# Patient Record
Sex: Male | Born: 1955 | Race: White | Hispanic: No | Marital: Married | Smoking: Never smoker
Health system: Southern US, Community
[De-identification: ages and names within clinical notes are randomized; demographics above are authoritative.]

## PROBLEM LIST (undated history)

## (undated) DIAGNOSIS — K219 Gastro-esophageal reflux disease without esophagitis: Secondary | ICD-10-CM

## (undated) DIAGNOSIS — F32A Depression, unspecified: Secondary | ICD-10-CM

## (undated) DIAGNOSIS — K7460 Unspecified cirrhosis of liver: Secondary | ICD-10-CM

## (undated) DIAGNOSIS — T8859XA Other complications of anesthesia, initial encounter: Secondary | ICD-10-CM

## (undated) DIAGNOSIS — C760 Malignant neoplasm of head, face and neck: Secondary | ICD-10-CM

## (undated) DIAGNOSIS — F419 Anxiety disorder, unspecified: Secondary | ICD-10-CM

## (undated) DIAGNOSIS — G4730 Sleep apnea, unspecified: Secondary | ICD-10-CM

## (undated) DIAGNOSIS — I10 Essential (primary) hypertension: Secondary | ICD-10-CM

## (undated) DIAGNOSIS — C801 Malignant (primary) neoplasm, unspecified: Secondary | ICD-10-CM

## (undated) HISTORY — PX: HEMORRHOID SURGERY: SHX153

## (undated) HISTORY — PX: EXCISION OF TONGUE LESION: SHX6434

## (undated) HISTORY — PX: FOOT SURGERY: SHX648

---

## 1999-04-02 ENCOUNTER — Emergency Department (HOSPITAL_COMMUNITY): Admission: EM | Admit: 1999-04-02 | Discharge: 1999-04-02 | Payer: Self-pay | Admitting: Emergency Medicine

## 1999-04-03 ENCOUNTER — Ambulatory Visit (HOSPITAL_COMMUNITY): Admission: RE | Admit: 1999-04-03 | Discharge: 1999-04-04 | Payer: Self-pay | Admitting: General Surgery

## 2001-05-10 ENCOUNTER — Encounter: Payer: Self-pay | Admitting: Emergency Medicine

## 2001-05-10 ENCOUNTER — Emergency Department (HOSPITAL_COMMUNITY): Admission: EM | Admit: 2001-05-10 | Discharge: 2001-05-10 | Payer: Self-pay | Admitting: Emergency Medicine

## 2005-11-30 ENCOUNTER — Encounter: Admission: RE | Admit: 2005-11-30 | Discharge: 2005-11-30 | Payer: Self-pay | Admitting: General Surgery

## 2006-02-20 ENCOUNTER — Ambulatory Visit (HOSPITAL_COMMUNITY): Admission: RE | Admit: 2006-02-20 | Discharge: 2006-02-20 | Payer: Self-pay | Admitting: General Surgery

## 2019-09-23 ENCOUNTER — Encounter: Payer: Self-pay | Admitting: Podiatry

## 2019-09-23 ENCOUNTER — Ambulatory Visit: Payer: No Typology Code available for payment source

## 2019-09-28 ENCOUNTER — Ambulatory Visit (INDEPENDENT_AMBULATORY_CARE_PROVIDER_SITE_OTHER): Payer: No Typology Code available for payment source | Admitting: Podiatry

## 2019-09-28 ENCOUNTER — Other Ambulatory Visit: Payer: Self-pay | Admitting: Podiatry

## 2019-09-28 ENCOUNTER — Encounter: Payer: Self-pay | Admitting: Podiatry

## 2019-09-28 ENCOUNTER — Other Ambulatory Visit: Payer: Self-pay

## 2019-09-28 ENCOUNTER — Ambulatory Visit (INDEPENDENT_AMBULATORY_CARE_PROVIDER_SITE_OTHER): Payer: No Typology Code available for payment source

## 2019-09-28 DIAGNOSIS — M7671 Peroneal tendinitis, right leg: Secondary | ICD-10-CM

## 2019-09-28 NOTE — Progress Notes (Signed)
  Subjective:  Patient ID: David Middleton, male    DOB: [birth year],  MRN: [id number] HPI Chief Complaint  Patient presents with  . Foot Pain    Lateral side (5th met base) right - aching x 6 months, no injury, tried taping and aspirin  . New Patient (Initial Visit)    64 y.o. male presents with the above complaint.   ROS: Denies fever chills nausea vomiting muscle aches pains calf pain back pain chest pain shortness of breath.  No past medical history on file.   Current Outpatient Medications:  .  gabapentin (NEURONTIN) 300 MG capsule, Take 300 mg by mouth at bedtime., Disp: , Rfl:  .  lisinopril (ZESTRIL) 20 MG tablet, Take 10 mg by mouth daily., Disp: , Rfl:  .  omeprazole (PRILOSEC) 20 MG capsule, Take 20 mg by mouth daily., Disp: , Rfl:  .  sertraline (ZOLOFT) 100 MG tablet, Take 100 mg by mouth daily., Disp: , Rfl:   No Known Allergies Review of Systems Objective:  There were no vitals filed for this visit.  General: Well developed, nourished, in no acute distress, alert and oriented x3   Dermatological: Skin is warm, dry and supple bilateral. Nails x 10 are well maintained; remaining integument appears unremarkable at this time. There are no open sores, no preulcerative lesions, no rash or signs of infection present.  Vascular: Dorsalis Pedis artery and Posterior Tibial artery pedal pulses are 2/4 bilateral with immedate capillary fill time. Pedal hair growth present. No varicosities and no lower extremity edema present bilateral.   Neruologic: Grossly intact via light touch bilateral. Vibratory intact via tuning fork bilateral. Protective threshold with Semmes Wienstein monofilament intact to all pedal sites bilateral. Patellar and Achilles deep tendon reflexes 2+ bilateral. No Babinski or clonus noted bilateral.   Musculoskeletal: No gross boney pedal deformities bilateral. No pain, crepitus, or limitation noted with foot and ankle range of motion bilateral. Muscular strength  5/5 in all groups tested bilateral.  Pain on palpation of the peroneus brevis tendon just proximal to the fifth metatarsal base of the right foot.  He has no pain on palpation of the fifth met base.  Gait: Unassisted, Nonantalgic.    Radiographs:  Soft tissue increase in density at the peroneal level.  There appears to be an old avulsion fracture off of the fifth met base.  There is appears to be an old transverse fracture near the fifth met base just proximal to the area of a Jones fracture.  An os perineum is noted.  Assessment & Plan:   Assessment: Peroneal tendinitis cannot rule out a tear of the posterior tibial tendon.  Plan: We discussed etiology pathology conservative versus surgical therapies at this point I put him in a Tri-Lock brace and injected the area with 2 mg of dexamethasone and local anesthetic.  Discussed the use of Voltaren gel and good shoe gear.  I will follow-up with him in 6 weeks.     Lasheena Frieze T. [place], [place]

## 2019-11-03 NOTE — Progress Notes (Signed)
This encounter was created in error - please disregard.

## 2019-11-09 ENCOUNTER — Encounter: Payer: Self-pay | Admitting: Podiatry

## 2019-11-09 ENCOUNTER — Other Ambulatory Visit: Payer: Self-pay

## 2019-11-09 ENCOUNTER — Ambulatory Visit (INDEPENDENT_AMBULATORY_CARE_PROVIDER_SITE_OTHER): Payer: No Typology Code available for payment source | Admitting: Podiatry

## 2019-11-09 DIAGNOSIS — M7671 Peroneal tendinitis, right leg: Secondary | ICD-10-CM | POA: Diagnosis not present

## 2019-11-09 NOTE — Progress Notes (Signed)
He presents today for follow-up of his peroneal tendinitis in his right foot.  States that he is doing great and was 100% better now it is regressed about 80%.  He states that I do not know why is coming back maybe have done something to it.  Objective: Vital signs are stable alert oriented x3.  Pulses are palpable.  There is no erythema edema cellulitis drainage or odor he has pain on palpation as the peroneus longus dives through the arcuate portion of the cuboid.  He has pain on plantarflexion and eversion against resistance.  This is consistent with possible tear or tendinitis of the peroneus longus tendon at that point.  Assessment: Cannot rule out peroneal longus tendinitis at the arcuate portion of the cuboid.  Plan: At this point I injected and once again start him on a Medrol Dosepak to be followed by meloxicam.  And he will utilize his Tri-Lock brace on which we dispensed today.  I will follow-up with him in 1 month if he has not improved considerably MRI will be necessary.

## 2019-12-21 ENCOUNTER — Ambulatory Visit: Payer: No Typology Code available for payment source | Admitting: Podiatry

## 2020-11-01 DIAGNOSIS — I10 Essential (primary) hypertension: Secondary | ICD-10-CM | POA: Diagnosis not present

## 2020-11-01 DIAGNOSIS — K219 Gastro-esophageal reflux disease without esophagitis: Secondary | ICD-10-CM | POA: Diagnosis not present

## 2020-11-01 DIAGNOSIS — K7460 Unspecified cirrhosis of liver: Secondary | ICD-10-CM | POA: Diagnosis not present

## 2020-11-01 DIAGNOSIS — Z9181 History of falling: Secondary | ICD-10-CM | POA: Diagnosis not present

## 2020-11-07 ENCOUNTER — Ambulatory Visit: Payer: No Typology Code available for payment source | Admitting: Podiatry

## 2020-11-07 ENCOUNTER — Other Ambulatory Visit: Payer: Self-pay

## 2020-11-07 ENCOUNTER — Encounter: Payer: Self-pay | Admitting: Podiatry

## 2020-11-07 ENCOUNTER — Ambulatory Visit (INDEPENDENT_AMBULATORY_CARE_PROVIDER_SITE_OTHER): Payer: Medicare Other

## 2020-11-07 DIAGNOSIS — M722 Plantar fascial fibromatosis: Secondary | ICD-10-CM

## 2020-11-07 MED ORDER — METHYLPREDNISOLONE 4 MG PO TBPK: ORAL_TABLET | ORAL | 0 refills | Status: AC

## 2020-11-07 MED ORDER — MELOXICAM 15 MG PO TABS: 15.0000 mg | ORAL_TABLET | Freq: Every day | ORAL | 3 refills | Status: DC

## 2020-11-07 MED ORDER — TRIAMCINOLONE ACETONIDE 40 MG/ML IJ SUSP
40.0000 mg | Freq: Once | INTRAMUSCULAR | Status: AC
  Administered 2020-11-07: 40 mg

## 2020-11-07 NOTE — Progress Notes (Signed)
He presents today chief complaint of plantar lateral here heel pain bilaterally right greater than left states been aching for a couple of months more pain in the p.m. after he has been sitting for a while and then gets back up to walk.  States that mornings really are not that bad.  Denies any changes in his past medical history medications allergies surgery social history.  Denies any new shoes or injuries.  Denies any new or an increase in activities.  Objective: Vital signs are stable alert and oriented x3.  Pulses are palpable neurologic sensorium is intact Deetjen reflexes are intact muscle strength is normal symmetrical.  He does have heel pain on palpation medial calcaneal tubercle as well as the lateral calcaneal tubercles right greater than left.  Radiographs taken today demonstrate calcaneal heel spurs plantar and posterior soft tissue increase in density plantar fascial kidney insertion site.  Assessment: Planter fasciitis.  Plan: I injected bilaterally started him on a Medrol Dosepak and I he will follow that with meloxicam.  Placed him in a plantar fascial brace bilaterally we discussed appropriate shoe gear stretching exercises and ice therapy.  I would like to follow-up with him in about 1 month

## 2020-11-07 NOTE — Patient Instructions (Signed)

## 2020-12-07 ENCOUNTER — Ambulatory Visit: Payer: Medicare Other | Admitting: Podiatry

## 2021-05-23 DIAGNOSIS — T1502XA Foreign body in cornea, left eye, initial encounter: Secondary | ICD-10-CM | POA: Diagnosis not present

## 2021-05-24 DIAGNOSIS — T1502XD Foreign body in cornea, left eye, subsequent encounter: Secondary | ICD-10-CM | POA: Diagnosis not present

## 2021-07-10 DIAGNOSIS — M9905 Segmental and somatic dysfunction of pelvic region: Secondary | ICD-10-CM | POA: Diagnosis not present

## 2021-07-10 DIAGNOSIS — M9903 Segmental and somatic dysfunction of lumbar region: Secondary | ICD-10-CM | POA: Diagnosis not present

## 2021-07-10 DIAGNOSIS — M5126 Other intervertebral disc displacement, lumbar region: Secondary | ICD-10-CM | POA: Diagnosis not present

## 2021-07-10 DIAGNOSIS — M5441 Lumbago with sciatica, right side: Secondary | ICD-10-CM | POA: Diagnosis not present

## 2021-07-11 DIAGNOSIS — M5416 Radiculopathy, lumbar region: Secondary | ICD-10-CM | POA: Diagnosis not present

## 2021-07-12 ENCOUNTER — Other Ambulatory Visit: Payer: Self-pay | Admitting: Family Medicine

## 2021-07-12 DIAGNOSIS — M5416 Radiculopathy, lumbar region: Secondary | ICD-10-CM

## 2021-07-19 ENCOUNTER — Other Ambulatory Visit (HOSPITAL_COMMUNITY): Payer: Self-pay | Admitting: Family Medicine

## 2021-07-19 DIAGNOSIS — M5416 Radiculopathy, lumbar region: Secondary | ICD-10-CM

## 2021-07-23 ENCOUNTER — Other Ambulatory Visit (HOSPITAL_COMMUNITY): Payer: Self-pay | Admitting: Family Medicine

## 2021-07-23 ENCOUNTER — Ambulatory Visit (HOSPITAL_COMMUNITY)
Admission: RE | Admit: 2021-07-23 | Discharge: 2021-07-23 | Disposition: A | Discharge status: Home / Self Care | Payer: Medicare Other | Source: Ambulatory Visit | Attending: Family Medicine | Admitting: Family Medicine

## 2021-07-23 DIAGNOSIS — M5416 Radiculopathy, lumbar region: Secondary | ICD-10-CM | POA: Diagnosis not present

## 2021-07-23 DIAGNOSIS — M48061 Spinal stenosis, lumbar region without neurogenic claudication: Secondary | ICD-10-CM | POA: Diagnosis not present

## 2021-07-23 DIAGNOSIS — Z0189 Encounter for other specified special examinations: Secondary | ICD-10-CM

## 2021-07-23 DIAGNOSIS — Z01818 Encounter for other preprocedural examination: Secondary | ICD-10-CM | POA: Diagnosis not present

## 2021-07-23 DIAGNOSIS — M5126 Other intervertebral disc displacement, lumbar region: Secondary | ICD-10-CM | POA: Diagnosis not present

## 2021-07-26 ENCOUNTER — Ambulatory Visit: Payer: Self-pay

## 2021-07-28 ENCOUNTER — Other Ambulatory Visit: Payer: Self-pay | Admitting: Neurosurgery

## 2021-07-28 DIAGNOSIS — M5127 Other intervertebral disc displacement, lumbosacral region: Secondary | ICD-10-CM | POA: Diagnosis not present

## 2021-08-02 ENCOUNTER — Encounter (HOSPITAL_COMMUNITY): Payer: Self-pay | Admitting: Neurosurgery

## 2021-08-02 ENCOUNTER — Other Ambulatory Visit: Payer: Self-pay

## 2021-08-02 NOTE — Progress Notes (Signed)
Per Dr. Therisa Doyne, the pt can have a light breakfast until 0700 day of surgery. Pt made aware.

## 2021-08-02 NOTE — Progress Notes (Signed)
PCP - Dr. Lisbeth Ply  Cardiologist - Denies  EP- Denies  Endocrine- Denies  Pulm- Denies  Chest x-ray - Denies  EKG - 08/03/21- Day of Surgery  Stress Test - Denies  ECHO - Denies  Cardiac Cath - Denies  AICD-na PM-na LOOP-na  Nerve Stimulator- Denies  Dialysis- Denies  Sleep Study - Yes- Positive CPAP - Denies  LABS- 08/03/21: CBC, CMP, PCR  ASA- Denies  ERAS- Yes- clears until 1355  HA1C- Denies  Anesthesia- No  Pt denies having chest pain, sob, or fever during the pre-op phone call. All instructions explained to the pt, with a verbal understanding of the material including: as of today,  stop taking all Aspirin (unless instructed by your doctor) and Other Aspirin containing products, Vitamins, Fish oils, and Herbal medications. Also stop all NSAIDS i.e. Advil, Ibuprofen, Motrin, Aleve, Anaprox, Naproxen, BC, Goody Powders, and all Supplements.  Pt also instructed to wear a mask and social distance if he goes out. The opportunity to ask questions was provided.

## 2021-08-03 ENCOUNTER — Observation Stay (HOSPITAL_COMMUNITY)
Admission: RE | Admit: 2021-08-03 | Discharge: 2021-08-04 | Disposition: A | Discharge status: Home / Self Care | Payer: Medicare Other | Attending: Neurosurgery | Admitting: Neurosurgery

## 2021-08-03 ENCOUNTER — Encounter (HOSPITAL_COMMUNITY): Payer: Self-pay | Admitting: Neurosurgery

## 2021-08-03 ENCOUNTER — Other Ambulatory Visit: Payer: Self-pay

## 2021-08-03 ENCOUNTER — Encounter (HOSPITAL_COMMUNITY)
Admission: RE | Disposition: A | Discharge status: Home / Self Care | Payer: Self-pay | Source: Home / Self Care | Attending: Neurosurgery

## 2021-08-03 ENCOUNTER — Ambulatory Visit (HOSPITAL_BASED_OUTPATIENT_CLINIC_OR_DEPARTMENT_OTHER): Payer: Medicare Other | Admitting: Anesthesiology

## 2021-08-03 ENCOUNTER — Ambulatory Visit (HOSPITAL_COMMUNITY): Payer: Medicare Other | Admitting: Anesthesiology

## 2021-08-03 ENCOUNTER — Ambulatory Visit (HOSPITAL_COMMUNITY): Payer: Medicare Other

## 2021-08-03 DIAGNOSIS — I10 Essential (primary) hypertension: Secondary | ICD-10-CM | POA: Diagnosis not present

## 2021-08-03 DIAGNOSIS — Z8589 Personal history of malignant neoplasm of other organs and systems: Secondary | ICD-10-CM | POA: Diagnosis not present

## 2021-08-03 DIAGNOSIS — Z79899 Other long term (current) drug therapy: Secondary | ICD-10-CM | POA: Insufficient documentation

## 2021-08-03 DIAGNOSIS — G4730 Sleep apnea, unspecified: Secondary | ICD-10-CM

## 2021-08-03 DIAGNOSIS — Z9889 Other specified postprocedural states: Secondary | ICD-10-CM | POA: Diagnosis not present

## 2021-08-03 DIAGNOSIS — M5127 Other intervertebral disc displacement, lumbosacral region: Secondary | ICD-10-CM

## 2021-08-03 DIAGNOSIS — F418 Other specified anxiety disorders: Secondary | ICD-10-CM

## 2021-08-03 DIAGNOSIS — M5126 Other intervertebral disc displacement, lumbar region: Secondary | ICD-10-CM | POA: Diagnosis present

## 2021-08-03 HISTORY — DX: Other complications of anesthesia, initial encounter: T88.59XA

## 2021-08-03 HISTORY — DX: Unspecified cirrhosis of liver: K74.60

## 2021-08-03 HISTORY — DX: Sleep apnea, unspecified: G47.30

## 2021-08-03 HISTORY — DX: Malignant neoplasm of head, face and neck: C76.0

## 2021-08-03 HISTORY — PX: LUMBAR LAMINECTOMY/DECOMPRESSION MICRODISCECTOMY: SHX5026

## 2021-08-03 HISTORY — DX: Malignant (primary) neoplasm, unspecified: C80.1

## 2021-08-03 HISTORY — DX: Gastro-esophageal reflux disease without esophagitis: K21.9

## 2021-08-03 HISTORY — DX: Anxiety disorder, unspecified: F41.9

## 2021-08-03 HISTORY — DX: Essential (primary) hypertension: I10

## 2021-08-03 HISTORY — DX: Depression, unspecified: F32.A

## 2021-08-03 LAB — COMPREHENSIVE METABOLIC PANEL
ALT: 51 U/L — ABNORMAL HIGH (ref 0–44)
AST: 39 U/L (ref 15–41)
Albumin: 4.3 g/dL (ref 3.5–5.0)
Alkaline Phosphatase: 43 U/L (ref 38–126)
Anion gap: 11 (ref 5–15)
BUN: 19 mg/dL (ref 8–23)
CO2: 24 mmol/L (ref 22–32)
Calcium: 9.4 mg/dL (ref 8.9–10.3)
Chloride: 102 mmol/L (ref 98–111)
Creatinine, Ser: 1.15 mg/dL (ref 0.61–1.24)
GFR, Estimated: >60 mL/min (ref >60)
Glucose, Bld: 104 mg/dL — ABNORMAL HIGH (ref 70–99)
Potassium: 3.8 mmol/L (ref 3.5–5.1)
Sodium: 137 mmol/L (ref 135–145)
Total Bilirubin: 0.7 mg/dL (ref 0.3–1.2)
Total Protein: 7.9 g/dL (ref 6.5–8.1)

## 2021-08-03 LAB — CBC
HCT: 46.2 % (ref 39.0–52.0)
Hemoglobin: 15.3 g/dL (ref 13.0–17.0)
MCH: 27.7 pg (ref 26.0–34.0)
MCHC: 33.1 g/dL (ref 30.0–36.0)
MCV: 83.7 fL (ref 80.0–100.0)
Platelets: 121 10*3/uL — ABNORMAL LOW (ref 150–400)
RBC: 5.52 MIL/uL (ref 4.22–5.81)
RDW: 13.2 % (ref 11.5–15.5)
WBC: 5.1 10*3/uL (ref 4.0–10.5)
nRBC: 0 % (ref 0.0–0.2)

## 2021-08-03 LAB — SURGICAL PCR SCREEN
MRSA, PCR: NEGATIVE
Staphylococcus aureus: POSITIVE — AB

## 2021-08-03 SURGERY — LUMBAR LAMINECTOMY/DECOMPRESSION MICRODISCECTOMY 1 LEVEL
Anesthesia: General | Site: Spine Lumbar | Laterality: Right

## 2021-08-03 MED ORDER — METHYLPREDNISOLONE ACETATE 80 MG/ML IJ SUSP
INTRAMUSCULAR | Status: DC | PRN
  Administered 2021-08-03: 80 mg

## 2021-08-03 MED ORDER — PHENYLEPHRINE 80 MCG/ML (10ML) SYRINGE FOR IV PUSH (FOR BLOOD PRESSURE SUPPORT)
PREFILLED_SYRINGE | INTRAVENOUS | Status: DC | PRN
  Administered 2021-08-03: 80 ug via INTRAVENOUS

## 2021-08-03 MED ORDER — GABAPENTIN 300 MG PO CAPS
300.0000 mg | ORAL_CAPSULE | Freq: Three times a day (TID) | ORAL | Status: DC
  Administered 2021-08-03 – 2021-08-04 (×2): 300 mg via ORAL
  Filled 2021-08-03 (×2): qty 1

## 2021-08-03 MED ORDER — DIAZEPAM 5 MG PO TABS
5.0000 mg | ORAL_TABLET | Freq: Four times a day (QID) | ORAL | Status: DC | PRN
Start: 2021-08-03 — End: 2021-08-04
  Administered 2021-08-03: 5 mg via ORAL
  Filled 2021-08-03: qty 1

## 2021-08-03 MED ORDER — FENTANYL CITRATE (PF) 250 MCG/5ML IJ SOLN
INTRAMUSCULAR | Status: DC | PRN
  Administered 2021-08-03: 50 ug via INTRAVENOUS
  Administered 2021-08-03 (×2): 100 ug via INTRAVENOUS

## 2021-08-03 MED ORDER — SCOPOLAMINE 1 MG/3DAYS TD PT72
MEDICATED_PATCH | TRANSDERMAL | Status: AC
  Filled 2021-08-03: qty 1

## 2021-08-03 MED ORDER — ONDANSETRON HCL 4 MG PO TABS: 4.0000 mg | ORAL_TABLET | Freq: Four times a day (QID) | ORAL | Status: DC | PRN

## 2021-08-03 MED ORDER — PROPOFOL 10 MG/ML IV BOLUS
INTRAVENOUS | Status: DC | PRN
  Administered 2021-08-03: 200 mg via INTRAVENOUS

## 2021-08-03 MED ORDER — OXYCODONE HCL 5 MG PO TABS: 5.0000 mg | ORAL_TABLET | ORAL | Status: DC | PRN

## 2021-08-03 MED ORDER — EPHEDRINE SULFATE-NACL 50-0.9 MG/10ML-% IV SOSY
PREFILLED_SYRINGE | INTRAVENOUS | Status: DC | PRN
  Administered 2021-08-03: 5 mg via INTRAVENOUS

## 2021-08-03 MED ORDER — 0.9 % SODIUM CHLORIDE (POUR BTL) OPTIME
TOPICAL | Status: DC | PRN
Start: 2021-08-03 — End: 2021-08-03
  Administered 2021-08-03: 1000 mL

## 2021-08-03 MED ORDER — GELATIN ABSORBABLE 50 EX MISC: CUTANEOUS | Status: DC | PRN

## 2021-08-03 MED ORDER — ZOLPIDEM TARTRATE 5 MG PO TABS: 5.0000 mg | ORAL_TABLET | Freq: Every evening | ORAL | Status: DC | PRN

## 2021-08-03 MED ORDER — MIDAZOLAM HCL 5 MG/5ML IJ SOLN
INTRAMUSCULAR | Status: DC | PRN
  Administered 2021-08-03: 2 mg via INTRAVENOUS

## 2021-08-03 MED ORDER — SUGAMMADEX SODIUM 200 MG/2ML IV SOLN
INTRAVENOUS | Status: DC | PRN
  Administered 2021-08-03: 200 mg via INTRAVENOUS

## 2021-08-03 MED ORDER — CHLORHEXIDINE GLUCONATE CLOTH 2 % EX PADS: 6.0000 | MEDICATED_PAD | Freq: Once | CUTANEOUS | Status: DC

## 2021-08-03 MED ORDER — MORPHINE SULFATE (PF) 2 MG/ML IV SOLN: 2.0000 mg | INTRAVENOUS | Status: DC | PRN

## 2021-08-03 MED ORDER — KETOROLAC TROMETHAMINE 15 MG/ML IJ SOLN
7.5000 mg | Freq: Four times a day (QID) | INTRAMUSCULAR | Status: DC
  Administered 2021-08-03 – 2021-08-04 (×2): 7.5 mg via INTRAVENOUS
  Filled 2021-08-03 (×2): qty 1

## 2021-08-03 MED ORDER — SODIUM CHLORIDE 0.9% FLUSH
3.0000 mL | Freq: Two times a day (BID) | INTRAVENOUS | Status: DC
  Administered 2021-08-04: 3 mL via INTRAVENOUS

## 2021-08-03 MED ORDER — MIDAZOLAM HCL 2 MG/2ML IJ SOLN
INTRAMUSCULAR | Status: AC
  Filled 2021-08-03: qty 2

## 2021-08-03 MED ORDER — ONDANSETRON HCL 4 MG/2ML IJ SOLN
INTRAMUSCULAR | Status: DC | PRN
  Administered 2021-08-03: 4 mg via INTRAVENOUS

## 2021-08-03 MED ORDER — DEXAMETHASONE SODIUM PHOSPHATE 10 MG/ML IJ SOLN
INTRAMUSCULAR | Status: AC
  Filled 2021-08-03: qty 1

## 2021-08-03 MED ORDER — LACTATED RINGERS IV SOLN: INTRAVENOUS | Status: DC

## 2021-08-03 MED ORDER — CHLORHEXIDINE GLUCONATE CLOTH 2 % EX PADS: 6.0000 | MEDICATED_PAD | Freq: Every day | CUTANEOUS | Status: DC

## 2021-08-03 MED ORDER — PANTOPRAZOLE SODIUM 40 MG PO TBEC
40.0000 mg | DELAYED_RELEASE_TABLET | Freq: Every day | ORAL | Status: DC
  Administered 2021-08-04: 40 mg via ORAL
  Filled 2021-08-03: qty 1

## 2021-08-03 MED ORDER — BUPIVACAINE HCL (PF) 0.5 % IJ SOLN
INTRAMUSCULAR | Status: DC | PRN
  Administered 2021-08-03: 20 mL

## 2021-08-03 MED ORDER — SERTRALINE HCL 50 MG PO TABS
200.0000 mg | ORAL_TABLET | Freq: Every day | ORAL | Status: DC
  Administered 2021-08-04: 200 mg via ORAL
  Filled 2021-08-03: qty 4

## 2021-08-03 MED ORDER — LIDOCAINE 2% (20 MG/ML) 5 ML SYRINGE
INTRAMUSCULAR | Status: AC
  Filled 2021-08-03: qty 5

## 2021-08-03 MED ORDER — PHENOL 1.4 % MT LIQD: 1.0000 | OROMUCOSAL | Status: DC | PRN

## 2021-08-03 MED ORDER — ADULT MULTIVITAMIN W/MINERALS CH
1.0000 | ORAL_TABLET | Freq: Every day | ORAL | Status: DC
  Filled 2021-08-03: qty 1

## 2021-08-03 MED ORDER — LIDOCAINE-EPINEPHRINE 0.5 %-1:200000 IJ SOLN
INTRAMUSCULAR | Status: DC | PRN
  Administered 2021-08-03: 10 mL

## 2021-08-03 MED ORDER — MUPIROCIN 2 % EX OINT
1.0000 "application " | TOPICAL_OINTMENT | Freq: Two times a day (BID) | CUTANEOUS | Status: DC
  Filled 2021-08-03: qty 22

## 2021-08-03 MED ORDER — ACETAMINOPHEN 650 MG RE SUPP: 650.0000 mg | RECTAL | Status: DC | PRN

## 2021-08-03 MED ORDER — ACETAMINOPHEN 325 MG PO TABS: 650.0000 mg | ORAL_TABLET | ORAL | Status: DC | PRN

## 2021-08-03 MED ORDER — LIDOCAINE-EPINEPHRINE 0.5 %-1:200000 IJ SOLN
INTRAMUSCULAR | Status: AC
  Filled 2021-08-03: qty 1

## 2021-08-03 MED ORDER — ACETAMINOPHEN 500 MG PO TABS
1000.0000 mg | ORAL_TABLET | Freq: Once | ORAL | Status: AC
Start: 2021-08-03 — End: 2021-08-03
  Administered 2021-08-03: 1000 mg via ORAL
  Filled 2021-08-03: qty 2

## 2021-08-03 MED ORDER — VANCOMYCIN HCL IN DEXTROSE 1-5 GM/200ML-% IV SOLN
1000.0000 mg | Freq: Once | INTRAVENOUS | Status: AC
Start: 2021-08-03 — End: 2021-08-03

## 2021-08-03 MED ORDER — METHYLPREDNISOLONE ACETATE 80 MG/ML IJ SUSP
INTRAMUSCULAR | Status: AC
  Filled 2021-08-03: qty 1

## 2021-08-03 MED ORDER — SCOPOLAMINE 1 MG/3DAYS TD PT72
1.0000 | MEDICATED_PATCH | TRANSDERMAL | Status: DC
  Administered 2021-08-03: 1.5 mg via TRANSDERMAL
  Filled 2021-08-03: qty 1

## 2021-08-03 MED ORDER — DEXAMETHASONE SODIUM PHOSPHATE 10 MG/ML IJ SOLN
INTRAMUSCULAR | Status: DC | PRN
  Administered 2021-08-03: 10 mg via INTRAVENOUS

## 2021-08-03 MED ORDER — CHLORHEXIDINE GLUCONATE CLOTH 2 % EX PADS
6.0000 | MEDICATED_PAD | Freq: Once | CUTANEOUS | Status: DC
Start: 2021-08-03 — End: 2021-08-03

## 2021-08-03 MED ORDER — LISINOPRIL 10 MG PO TABS
10.0000 mg | ORAL_TABLET | Freq: Every morning | ORAL | Status: DC
  Administered 2021-08-04: 10 mg via ORAL
  Filled 2021-08-03: qty 1

## 2021-08-03 MED ORDER — ONDANSETRON HCL 4 MG/2ML IJ SOLN: 4.0000 mg | Freq: Once | INTRAMUSCULAR | Status: DC | PRN

## 2021-08-03 MED ORDER — MUPIROCIN 2 % EX OINT
1.0000 "application " | TOPICAL_OINTMENT | Freq: Two times a day (BID) | CUTANEOUS | Status: DC
  Administered 2021-08-03 – 2021-08-04 (×2): 1 via NASAL
  Filled 2021-08-03: qty 22

## 2021-08-03 MED ORDER — ONDANSETRON HCL 4 MG/2ML IJ SOLN: 4.0000 mg | Freq: Four times a day (QID) | INTRAMUSCULAR | Status: DC | PRN

## 2021-08-03 MED ORDER — FENTANYL CITRATE (PF) 100 MCG/2ML IJ SOLN
INTRAMUSCULAR | Status: DC | PRN
Start: 2021-08-03 — End: 2021-08-03
  Administered 2021-08-03: 100 ug via INTRAVENOUS

## 2021-08-03 MED ORDER — LABETALOL HCL 5 MG/ML IV SOLN
INTRAVENOUS | Status: AC
  Filled 2021-08-03: qty 4

## 2021-08-03 MED ORDER — FENTANYL CITRATE (PF) 250 MCG/5ML IJ SOLN
INTRAMUSCULAR | Status: AC
  Filled 2021-08-03: qty 5

## 2021-08-03 MED ORDER — ORAL CARE MOUTH RINSE: 15.0000 mL | Freq: Once | OROMUCOSAL | Status: AC

## 2021-08-03 MED ORDER — TRAZODONE HCL 50 MG PO TABS
100.0000 mg | ORAL_TABLET | Freq: Every day | ORAL | Status: DC
  Administered 2021-08-03: 100 mg via ORAL
  Filled 2021-08-03: qty 2

## 2021-08-03 MED ORDER — THROMBIN (RECOMBINANT) 5000 UNITS EX SOLR
CUTANEOUS | Status: AC
  Filled 2021-08-03: qty 10000

## 2021-08-03 MED ORDER — ROCURONIUM BROMIDE 10 MG/ML (PF) SYRINGE
PREFILLED_SYRINGE | INTRAVENOUS | Status: DC | PRN
  Administered 2021-08-03: 20 mg via INTRAVENOUS
  Administered 2021-08-03: 50 mg via INTRAVENOUS

## 2021-08-03 MED ORDER — ROCURONIUM BROMIDE 10 MG/ML (PF) SYRINGE
PREFILLED_SYRINGE | INTRAVENOUS | Status: AC
  Filled 2021-08-03: qty 10

## 2021-08-03 MED ORDER — BUPIVACAINE HCL (PF) 0.5 % IJ SOLN
INTRAMUSCULAR | Status: AC
  Filled 2021-08-03: qty 30

## 2021-08-03 MED ORDER — FENTANYL CITRATE (PF) 100 MCG/2ML IJ SOLN
INTRAMUSCULAR | Status: AC
  Filled 2021-08-03: qty 2

## 2021-08-03 MED ORDER — MENTHOL 3 MG MT LOZG: 1.0000 | LOZENGE | OROMUCOSAL | Status: DC | PRN

## 2021-08-03 MED ORDER — VANCOMYCIN HCL IN DEXTROSE 1-5 GM/200ML-% IV SOLN
INTRAVENOUS | Status: AC
  Administered 2021-08-03: 1000 mg via INTRAVENOUS
  Filled 2021-08-03: qty 200

## 2021-08-03 MED ORDER — HYDROCODONE-ACETAMINOPHEN 7.5-325 MG PO TABS
1.0000 | ORAL_TABLET | Freq: Four times a day (QID) | ORAL | Status: DC
  Administered 2021-08-03 – 2021-08-04 (×2): 1 via ORAL
  Filled 2021-08-03 (×2): qty 1

## 2021-08-03 MED ORDER — FENTANYL CITRATE (PF) 100 MCG/2ML IJ SOLN
25.0000 ug | INTRAMUSCULAR | Status: DC | PRN
  Administered 2021-08-03: 50 ug via INTRAVENOUS

## 2021-08-03 MED ORDER — ONDANSETRON HCL 4 MG/2ML IJ SOLN
INTRAMUSCULAR | Status: AC
  Filled 2021-08-03: qty 2

## 2021-08-03 MED ORDER — SODIUM CHLORIDE 0.9 % IV SOLN: 250.0000 mL | INTRAVENOUS | Status: DC

## 2021-08-03 MED ORDER — OXYCODONE HCL 5 MG PO TABS
10.0000 mg | ORAL_TABLET | ORAL | Status: DC | PRN
  Administered 2021-08-04: 10 mg via ORAL
  Filled 2021-08-03: qty 2

## 2021-08-03 MED ORDER — PROPOFOL 10 MG/ML IV BOLUS
INTRAVENOUS | Status: AC
  Filled 2021-08-03: qty 20

## 2021-08-03 MED ORDER — LIDOCAINE 2% (20 MG/ML) 5 ML SYRINGE
INTRAMUSCULAR | Status: DC | PRN
  Administered 2021-08-03: 30 mg via INTRAVENOUS

## 2021-08-03 MED ORDER — CHLORHEXIDINE GLUCONATE 0.12 % MT SOLN
15.0000 mL | Freq: Once | OROMUCOSAL | Status: AC
  Administered 2021-08-03: 15 mL via OROMUCOSAL
  Filled 2021-08-03: qty 15

## 2021-08-03 MED ORDER — POTASSIUM CHLORIDE IN NACL 20-0.9 MEQ/L-% IV SOLN: INTRAVENOUS | Status: DC

## 2021-08-03 MED ORDER — SODIUM CHLORIDE 0.9% FLUSH: 3.0000 mL | INTRAVENOUS | Status: DC | PRN

## 2021-08-03 MED ORDER — LABETALOL HCL 5 MG/ML IV SOLN
5.0000 mg | INTRAVENOUS | Status: DC | PRN
  Administered 2021-08-03 (×2): 5 mg via INTRAVENOUS

## 2021-08-03 MED ORDER — CEFAZOLIN SODIUM-DEXTROSE 2-4 GM/100ML-% IV SOLN
2.0000 g | INTRAVENOUS | Status: DC
  Filled 2021-08-03: qty 100

## 2021-08-03 SURGICAL SUPPLY — 40 items
BAG COUNTER SPONGE SURGICOUNT (BAG) ×2 IMPLANT
BAND RUBBER #18 3X1/16 STRL (MISCELLANEOUS) ×4 IMPLANT
BLADE CLIPPER SURG (BLADE) ×1 IMPLANT
BUR MATCHSTICK NEURO 3.0 LAGG (BURR) ×2 IMPLANT
BUR PRECISION FLUTE 5.0 (BURR) ×1 IMPLANT
CANISTER SUCT 3000ML PPV (MISCELLANEOUS) ×2 IMPLANT
CARTRIDGE OIL MAESTRO DRILL (MISCELLANEOUS) ×1 IMPLANT
DERMABOND ADVANCED (GAUZE/BANDAGES/DRESSINGS) ×1
DERMABOND ADVANCED .7 DNX12 (GAUZE/BANDAGES/DRESSINGS) ×1 IMPLANT
DIFFUSER DRILL AIR PNEUMATIC (MISCELLANEOUS) ×2 IMPLANT
DRAPE LAPAROTOMY 100X72X124 (DRAPES) ×2 IMPLANT
DRAPE MICROSCOPE LEICA (MISCELLANEOUS) ×2 IMPLANT
DURAPREP 26ML APPLICATOR (WOUND CARE) ×2 IMPLANT
ELECT REM PT RETURN 9FT ADLT (ELECTROSURGICAL) ×2
ELECTRODE REM PT RTRN 9FT ADLT (ELECTROSURGICAL) ×1 IMPLANT
GLOVE ECLIPSE 6.5 STRL STRAW (GLOVE) ×2 IMPLANT
GOWN STRL REUS W/ TWL LRG LVL3 (GOWN DISPOSABLE) ×2 IMPLANT
GOWN STRL REUS W/ TWL XL LVL3 (GOWN DISPOSABLE) IMPLANT
GOWN STRL REUS W/TWL LRG LVL3 (GOWN DISPOSABLE) ×4
GOWN STRL REUS W/TWL XL LVL3 (GOWN DISPOSABLE) ×4
KIT BASIN OR (CUSTOM PROCEDURE TRAY) ×2 IMPLANT
KIT TURNOVER KIT B (KITS) ×2 IMPLANT
NDL HYPO 18GX1.5 BLUNT FILL (NEEDLE) IMPLANT
NDL HYPO 25X1 1.5 SAFETY (NEEDLE) ×1 IMPLANT
NDL SPNL 18GX3.5 QUINCKE PK (NEEDLE) IMPLANT
NEEDLE HYPO 18GX1.5 BLUNT FILL (NEEDLE) ×2 IMPLANT
NEEDLE HYPO 25X1 1.5 SAFETY (NEEDLE) ×2 IMPLANT
NEEDLE SPNL 18GX3.5 QUINCKE PK (NEEDLE) ×2 IMPLANT
NS IRRIG 1000ML POUR BTL (IV SOLUTION) ×2 IMPLANT
OIL CARTRIDGE MAESTRO DRILL (MISCELLANEOUS) ×2
PACK LAMINECTOMY NEURO (CUSTOM PROCEDURE TRAY) ×2 IMPLANT
SPONGE SURGIFOAM ABS GEL SZ50 (HEMOSTASIS) ×2 IMPLANT
SUT VIC AB 0 CT1 18XCR BRD8 (SUTURE) ×1 IMPLANT
SUT VIC AB 0 CT1 8-18 (SUTURE) ×2
SUT VIC AB 2-0 CT1 18 (SUTURE) ×2 IMPLANT
SUT VIC AB 3-0 SH 8-18 (SUTURE) ×2 IMPLANT
SYR 3ML LL SCALE MARK (SYRINGE) ×1 IMPLANT
TOWEL GREEN STERILE (TOWEL DISPOSABLE) ×2 IMPLANT
TOWEL GREEN STERILE FF (TOWEL DISPOSABLE) ×2 IMPLANT
WATER STERILE IRR 1000ML POUR (IV SOLUTION) ×2 IMPLANT

## 2021-08-03 NOTE — H&P (Signed)
David Middleton is an 66 y.o. male.   Chief Complaint: hnp Right L5/S1 HPI: David Middleton presents with severe pain in the right lower extremity.   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 No pertinent family history. Social History:  reports that he has never smoked. He has never used smokeless tobacco. He reports that he does not currently use alcohol. He reports that he does not use drugs.  Allergies: No Known Allergies  Medications Prior to Admission  Medication Sig Dispense Refill   gabapentin (NEURONTIN) 300 MG capsule Take 300 mg by mouth 3 (three) times daily.     Ibuprofen-Acetaminophen (ADVIL DUAL ACTION) 125-250 MG TABS Take 2 tablets by mouth 2 (two) times daily as needed (pain).     lisinopril (ZESTRIL) 40 MG tablet Take 10 mg by mouth in the morning.     Multiple Vitamins-Minerals (MULTIVITAMIN WITH MINERALS) tablet Take 1 tablet by mouth daily.     omeprazole (PRILOSEC) 20 MG capsule Take 40 mg by mouth in the morning.     oxyCODONE (OXY IR/ROXICODONE) 5 MG immediate release tablet Take 5 mg by mouth 2 (two) times daily as needed for pain.     sertraline (ZOLOFT) 100 MG tablet Take 200 mg by mouth daily.     traZODone (DESYREL) 100 MG tablet Take 100 mg by mouth at bedtime.     meloxicam (MOBIC) 15 MG tablet Take 1 tablet (15 mg total) by mouth daily. (Patient not taking: Reported on 07/31/2021) 30 tablet 3   methylPREDNISolone (MEDROL DOSEPAK) 4 MG TBPK tablet 6 day dose pack - take as directed (Patient not taking: Reported on 07/31/2021) 21 tablet 0    Results for orders placed or  performed during the hospital encounter of 08/03/21 (from the past 48 hour(s))  CBC per protocol     Status: Abnormal   Collection Time: 08/03/21  3:28 PM  Result Value Ref Range   WBC 5.1 4.0 - 10.5 K/uL   RBC 5.52 4.22 - 5.81 MIL/uL   Hemoglobin 15.3 13.0 - 17.0 g/dL   HCT 46.2 39.0 - 52.0 %   MCV 83.7 80.0 - 100.0 fL   MCH 27.7 26.0 - 34.0 pg   MCHC 33.1 30.0 - 36.0 g/dL   RDW 13.2 11.5 - 15.5 %   Platelets 121 (L) 150 - 400 K/uL    Comment: Immature Platelet Fraction may be clinically indicated, consider ordering this additional test [id number] REPEATED TO VERIFY    nRBC 0.0 0.0 - 0.2 %    Comment: Performed at [facility], [place] [address] [place], [place] [address]  Comprehensive metabolic panel per protocol     Status: Abnormal   Collection Time: 08/03/21  3:28 PM  Result Value Ref Range   Sodium 137 135 - 145 mmol/L   Potassium 3.8 3.5 - 5.1 mmol/L   Chloride 102 98 - 111 mmol/L   CO2 24 22 -  32 mmol/L   Glucose, Bld 104 (H) 70 - 99 mg/dL    Comment: Glucose reference range applies only to samples taken after fasting for at least 8 hours.   BUN 19 8 - 23 mg/dL   Creatinine, Ser 1.15 0.61 - 1.24 mg/dL   Calcium 9.4 8.9 - 10.3 mg/dL   Total Protein 7.9 6.5 - 8.1 g/dL   Albumin 4.3 3.5 - 5.0 g/dL   AST 39 15 - 41 U/L   ALT 51 (H) 0 - 44 U/L   Alkaline Phosphatase 43 38 - 126 U/L   Total Bilirubin 0.7 0.3 - 1.2 mg/dL   GFR, Estimated >60 >60 mL/min    Comment: (NOTE) Calculated using the CKD-EPI Creatinine Equation (2021)    Anion gap 11 5 - 15    Comment: Performed at [facility] [address] [place], [place] [address]   No results found.  Review of Systems  Constitutional: Negative.   HENT: Negative.    Eyes: Negative.   Respiratory: Negative.    Cardiovascular: Negative.   Gastrointestinal: Negative.   Endocrine: Negative.   Genitourinary: Negative.   Musculoskeletal:  Positive for back pain.  Neurological: Negative.   Hematological:  Negative.   Psychiatric/Behavioral: Negative.     Blood pressure (!) 182/89, pulse 94, temperature 98.1 F (36.7 C), resp. rate 17, height '5\' 8"'$  (1.727 m), weight 88.5 kg, SpO2 96 %. Physical Exam Constitutional:      General: He is in acute distress.     Appearance: Normal appearance.  HENT:     Head: Normocephalic and atraumatic.     Right Ear: External ear normal.     Left Ear: External ear normal.     Nose: Nose normal.     Mouth/Throat:     Mouth: Mucous membranes are moist.     Pharynx: Oropharynx is clear.  Eyes:     Extraocular Movements: Extraocular movements intact.     Conjunctiva/sclera: Conjunctivae normal.     Pupils: Pupils are equal, round, and reactive to light.  Cardiovascular:     Rate and Rhythm: Normal rate and regular rhythm.  Pulmonary:     Effort: Pulmonary effort is normal.     Breath sounds: Normal breath sounds.  Abdominal:     General: Abdomen is flat. Bowel sounds are normal.  Musculoskeletal:        General: Normal range of motion.     Cervical back: Normal range of motion.  Skin:    General: Skin is warm.  Neurological:     General: No focal deficit present.     Mental Status: He is alert and oriented to person, place, and time.     Cranial Nerves: No cranial nerve deficit.     Motor: Weakness present.  Psychiatric:        Mood and Affect: Mood normal.        Behavior: Behavior normal.        Thought Content: Thought content normal.        Judgment: Judgment normal.     Assessment/Plan HNP right L5/S1 causing pain in the right lower extremity. BP (!) 182/89   Pulse 94   Temp 98.1 F (36.7 C)   Resp 17   Ht '5\' 8"'$  (1.727 m)   Wt 88.5 kg   SpO2 96%   BMI 29.65 kg/m  David Middleton has decided to undergo a lumbar discetomy/decompression for HNP at levels right L5/1. Risks and benefits including but not  limited to bleeding, infection, paralysis, weakness in one or both extremities, bowel and/or bladder dysfunction, need for further  surgery, no relief of pain. David Middleton understands and wishes to proceed.  Ashok Pall, MD 08/03/2021, 5:40 PM

## 2021-08-03 NOTE — Anesthesia Procedure Notes (Signed)
Procedure Name: Intubation Date/Time: 08/03/2021 7:20 PM Performed by: Eligha Bridegroom, CRNA Pre-anesthesia Checklist: Patient identified, Emergency Drugs available and Patient being monitored Patient Re-evaluated:Patient Re-evaluated prior to induction Oxygen Delivery Method: Circle system utilized Preoxygenation: Pre-oxygenation with 100% oxygen Induction Type: IV induction Ventilation: Mask ventilation without difficulty and Oral airway inserted - appropriate to patient size Laryngoscope Size: Mac and 3 Grade View: Grade I Tube type: Oral Tube size: 7.5 mm Number of attempts: 1 Airway Equipment and Method: Stylet Placement Confirmation: ETT inserted through vocal cords under direct vision, positive ETCO2 and breath sounds checked- equal and bilateral Secured at: 21 cm Tube secured with: Tape Dental Injury: Teeth and Oropharynx as per pre-operative assessment

## 2021-08-03 NOTE — Anesthesia Preprocedure Evaluation (Addendum)
Anesthesia Evaluation  Patient identified by MRN, date of birth, ID band Patient awake    Reviewed: Allergy & Precautions, NPO status , Patient's Chart, lab work & pertinent test results  History of Anesthesia Complications (+) PROLONGED EMERGENCE and history of anesthetic complications  Airway Mallampati: III  TM Distance: >3 FB Neck ROM: Full    Dental  (+) Dental Advisory Given, Poor Dentition, Missing   Pulmonary sleep apnea ,    Pulmonary exam normal breath sounds clear to auscultation       Cardiovascular hypertension, Pt. on medications Normal cardiovascular exam Rhythm:Regular Rate:Normal     Neuro/Psych PSYCHIATRIC DISORDERS Anxiety Depression Displacement of lumbosacral intervertebral disc    GI/Hepatic GERD  Medicated,(+) Cirrhosis       ,   Endo/Other  negative endocrine ROS  Renal/GU negative Renal ROS     Musculoskeletal negative musculoskeletal ROS (+)   Abdominal   Peds  Hematology negative hematology ROS (+)   Anesthesia Other Findings Day of surgery medications reviewed with the patient.  Reproductive/Obstetrics                            Anesthesia Physical Anesthesia Plan  ASA: 3  Anesthesia Plan: General   Post-op Pain Management: Tylenol PO (pre-op)*   Induction: Intravenous  PONV Risk Score and Plan: 2 and Midazolam, Dexamethasone and Ondansetron  Airway Management Planned: Oral ETT  Additional Equipment:   Intra-op Plan:   Post-operative Plan: Extubation in OR  Informed Consent: I have reviewed the patients History and Physical, chart, labs and discussed the procedure including the risks, benefits and alternatives for the proposed anesthesia with the patient or authorized representative who has indicated his/her understanding and acceptance.     Dental advisory given  Plan Discussed with: CRNA  Anesthesia Plan Comments:         Anesthesia Quick Evaluation

## 2021-08-03 NOTE — Transfer of Care (Signed)
Immediate Anesthesia Transfer of Care Note  Patient: David Middleton  Procedure(s) Performed: Right Lumbar Five-Sacral One Discectomy (Right: Spine Lumbar)  Patient Location: PACU  Anesthesia Type:General  Level of Consciousness: sedated  Airway & Oxygen Therapy: Patient connected to nasal cannula oxygen  Post-op Assessment: Report given to RN and Post -op Vital signs reviewed and stable  Post vital signs: Reviewed and stable  Last Vitals:  Vitals Value Taken Time  BP 187/88   Temp 97.6   Pulse 110   Resp 18   SpO2 95     Last Pain:  Vitals:   08/03/21 1518  PainSc: 7       Patients Stated Pain Goal: 2 (42/68/34 1962)  Complications: No notable events documented.

## 2021-08-03 NOTE — Op Note (Signed)
08/03/2021  9:22 PM  PATIENT:  David Middleton  66 y.o. male Presents with an hnp L5/S1 right side PRE-OPERATIVE DIAGNOSIS:  Displacement of lumbosacral intervertebral disc  POST-OPERATIVE DIAGNOSIS:  Displacement of lumbosacral intervertebral disc  PROCEDURE:  Procedure(s): Right Lumbar Five-Sacral One Discectomy  SURGEON:   Surgeon(s): Ashok Pall, MD  ASSISTANTS:none  ANESTHESIA:   local and general  EBL:  Total I/O In: 2000 [I.V.:2000] Out: 50 [Blood:50]  BLOOD ADMINISTERED:none  CELL SAVER GIVEN:na  COUNT:per nursing  DRAINS: none   SPECIMEN:  No Specimen  DICTATION: David Middleton was taken to the operating room, intubated and placed under a general anesthetic without difficulty. He was positioned prone on a Wilson frame with all pressure points padded. His back was prepped and draped in a sterile manner. I opened the skin with a 10 blade and carried the dissection down to the thoracolumbar fascia. I used both sharp dissection and the monopolar cautery to expose the lamina of L5, and S1. I confirmed my location with an intraoperative xray.  I opened the ligamentum flavum between the lamina of L5 and S1 on the right side. I used the punches to remove the ligamentum flavum to expose the thecal sac. I brought the microscope into the operative field.  I started the decompression of the spinal canal, thecal sac and S1 and L5 root(s). I cauterized epidural veins overlying the disc space then divided them sharply. I opened the disc space with a 15 blade and proceeded with the discectomy. I used pituitary rongeurs, curettes, and other instruments to remove disc material. After the discectomy was completed I inspected the S1  nerve root and felt it was well decompressed. I explored rostrally, laterally, medially, and caudally and was satisfied with the decompression. I irrigated the wound, then closed in layers. I approximated the thoracolumbar fascia, subcutaneous, and subcuticular planes  with vicryl sutures. I used dermabond for a sterile dressing.   PLAN OF CARE: Admit for overnight observation  PATIENT DISPOSITION:  PACU - hemodynamically stable.   Delay start of Pharmacological VTE agent (>24hrs) due to surgical blood loss or risk of bleeding:  no

## 2021-08-04 ENCOUNTER — Encounter (HOSPITAL_COMMUNITY): Payer: Self-pay | Admitting: Neurosurgery

## 2021-08-04 DIAGNOSIS — I10 Essential (primary) hypertension: Secondary | ICD-10-CM | POA: Diagnosis not present

## 2021-08-04 DIAGNOSIS — Z79899 Other long term (current) drug therapy: Secondary | ICD-10-CM | POA: Diagnosis not present

## 2021-08-04 DIAGNOSIS — M5127 Other intervertebral disc displacement, lumbosacral region: Secondary | ICD-10-CM | POA: Diagnosis not present

## 2021-08-04 DIAGNOSIS — Z8589 Personal history of malignant neoplasm of other organs and systems: Secondary | ICD-10-CM | POA: Diagnosis not present

## 2021-08-04 MED ORDER — OXYCODONE HCL 5 MG PO TABS: 5.0000 mg | ORAL_TABLET | Freq: Four times a day (QID) | ORAL | 0 refills | Status: AC | PRN

## 2021-08-04 MED ORDER — TIZANIDINE HCL 4 MG PO TABS: 4.0000 mg | ORAL_TABLET | Freq: Four times a day (QID) | ORAL | 0 refills | Status: AC | PRN

## 2021-08-04 NOTE — Progress Notes (Signed)
Patient awaiting transport to his vehicle for discharge home; in no acute distress nor complaints of pain nor discomfort; moves all extremities well; incision on his lower back with skin glue and is clean, dry and intact; room was checked for all his belongings and was taken along with him; discharge instructions concerning his medications, incision care, follow up appointment and when to call the doctor as needed were all discussed with patient and his wife by RN and both expressed understanding on the instructions given.

## 2021-08-04 NOTE — Anesthesia Postprocedure Evaluation (Signed)
Anesthesia Post Note  Patient: David Middleton  Procedure(s) Performed: Right Lumbar Five-Sacral One Discectomy (Right: Spine Lumbar)     Patient location during evaluation: Other Anesthesia Type: General Level of consciousness: awake and alert Pain management: pain level controlled Vital Signs Assessment: post-procedure vital signs reviewed and stable Respiratory status: spontaneous breathing, nonlabored ventilation and respiratory function stable Cardiovascular status: blood pressure returned to baseline and stable Postop Assessment: no apparent nausea or vomiting Anesthetic complications: no   No notable events documented.  Last Vitals:  Vitals:   08/03/21 2250 08/04/21 0316  BP: (!) 174/85 (!) 181/89  Pulse: 90 91  Resp: 17 17  Temp: 37.1 C 37.1 C  SpO2: 96% 95%    Last Pain:  Vitals:   08/04/21 0330  TempSrc:   PainSc: 7                  Josip Merolla,W. [place]

## 2021-08-04 NOTE — Discharge Instructions (Addendum)
Lumbar Discectomy °Care After °A discectomy involves removal of discmaterial (the cartilage-like structures located between the bones of the back). It is done to relieve pressure on nerve roots. It can be used as a treatment for a back problem. The time in surgery depends on the findings in surgery and what is necessary to correct the problems. °HOME CARE INSTRUCTIONS  °· Check the cut (incision) made by the surgeon twice a day for signs of infection. Some signs of infection may include:  °· A foul smelling, greenish or yellowish discharge from the wound.  °· Increased pain.  °· Increased redness over the incision (operative) site.  °· The skin edges may separate.  °· Flu-like symptoms (problems).  °· A temperature above 101.5° F (38.6° C).  °· Change your bandages in about 24 to 36 hours following surgery or as directed.  °· You may shower tomrrow.  Avoid bathtubs, swimming pools and hot tubs for three weeks or until your incision has healed completely. °· Follow your doctor's instructions as to safe activities, exercises, and physical therapy.  °· Weight reduction may be beneficial if you are overweight.  °· Daily exercise is helpful to prevent the return of problems. Walking is permitted. You may use a treadmill without an incline. Cut down on activities and exercise if you have discomfort. You may also go up and down stairs as much as you can tolerate.  °· DO NOT lift anything heavier than 10 to 15 lbs. Avoid bending or twisting at the waist. Always bend your knees when lifting.  °· Maintain strength and range of motion as instructed.  °· Do not drive for 10 days, or as directed by your doctors. You may be a passenger . Lying back in the passenger seat may be more comfortable for you. Always wear a seatbelt.  °· Limit your sitting in a regular chair to 20 to 30 minutes at a time. There are no limitations for sitting in a recliner. You should lie down or walk in between sitting periods.  °· Only take  over-the-counter or prescription medicines for pain, discomfort, or fever as directed by your caregiver.  °SEEK MEDICAL CARE IF:  °· There is increased bleeding (more than a small spot) from the wound.  °· You notice redness, swelling, or increasing pain in the wound.  °· Pus is coming from wound.  °· You develop an unexplained oral temperature above 102° F (38.9° C) develops.  °· You notice a foul smell coming from the wound or dressing.  °· You have increasing pain in your wound.  °SEEK IMMEDIATE MEDICAL CARE IF:  °· You develop a rash.  °· You have difficulty breathing.  °· You develop any allergic problems to medicines given.  °Document Released: 02/01/2004 Document Revised: 02/15/2011 Document Reviewed: 05/22/2007 °ExitCare® Patient Information °

## 2021-08-04 NOTE — Evaluation (Signed)
Physical Therapy Evaluation & Discharge  Patient Details Name: David Middleton MRN: [id number] DOB: [birth year] Today's Date: 08/04/2021  History of Present Illness  66 y/o male admitted on 08/03/21 following R L5-S1 discectomy. PMH: hx of neck cancer, HTN, liver cirrhosis, sleep apnea  Clinical Impression  Patient admitted following above procedure. Patient functioning at modI level for mobility with no AD. Able to safely negotiate stairs to access home environment. Educated patient on back precautions, ADL completion (LB dressing, brushing teeth, use of reacher, etc.), and activity progression, patient demonstrated understanding. Good awareness of techniques to maintain precautions with mobility. No further skilled PT needs identified acutely. No PT follow up recommended at this time.        Recommendations for follow up therapy are one component of a multi-disciplinary discharge planning process, led by the attending physician.  Recommendations may be updated based on patient status, additional functional criteria and insurance authorization.  Follow Up Recommendations No PT follow up    Assistance Recommended at Discharge PRN  Patient can return home with the following       Equipment Recommendations None recommended by PT  Recommendations for Other Services       Functional Status Assessment Patient has had a recent decline in their functional status and demonstrates the ability to make significant improvements in function in a reasonable and predictable amount of time.     Precautions / Restrictions Precautions Precautions: Back Precaution Booklet Issued: Yes (comment) Required Braces or Orthoses:  (No brace needed per orders) Restrictions Weight Bearing Restrictions: No      Mobility  Bed Mobility Overal bed mobility: Modified Independent             General bed mobility comments: instructed on log roll technique    Transfers Overall transfer level: Modified  independent Equipment used: None               General transfer comment: good awareness of technique to sit and stand to follow back precautions    Ambulation/Gait Ambulation/Gait assistance: Modified independent (Device/Increase time) Gait Distance (Feet): 200 Feet Assistive device: None Gait Pattern/deviations: WFL(Within Functional Limits)   Gait velocity interpretation: >2.62 ft/sec, indicative of community ambulatory      Stairs Stairs: Yes Stairs assistance: Modified independent (Device/Increase time) Stair Management: Two rails, Alternating pattern, Forwards Number of Stairs: 5    Wheelchair Mobility    Modified Rankin (Stroke Patients Only)       Balance Overall balance assessment: No apparent balance deficits (not formally assessed)                                           Pertinent Vitals/Pain Pain Assessment Pain Assessment: Faces Faces Pain Scale: Hurts little more Pain Location: back Pain Descriptors / Indicators: Discomfort, Sore Pain Intervention(s): Monitored during session, Repositioned    Home Living Family/patient expects to be discharged to:: Private residence Living Arrangements: Spouse/significant other Available Help at Discharge: Family Type of Home: House Home Access: Stairs to enter Entrance Stairs-Rails: Can reach both Entrance Stairs-Number of Steps: 5   Home Layout: One level Home Equipment: None      Prior Function Prior Level of Function : Independent/Modified Independent;Driving                     Hand Dominance        Extremity/Trunk Assessment   Upper  Extremity Assessment Upper Extremity Assessment: Overall WFL for tasks assessed    Lower Extremity Assessment Lower Extremity Assessment: Overall WFL for tasks assessed    Cervical / Trunk Assessment Cervical / Trunk Assessment: Back Surgery  Communication   Communication: No difficulties  Cognition Arousal/Alertness:  Awake/alert Behavior During Therapy: WFL for tasks assessed/performed Overall Cognitive Status: Within Functional Limits for tasks assessed                                          General Comments      Exercises     Assessment/Plan    PT Assessment Patient does not need any further PT services  PT Problem List         PT Treatment Interventions      PT Goals (Current goals can be found in the Care Plan section)  Acute Rehab PT Goals Patient Stated Goal: to go home PT Goal Formulation: All assessment and education complete, DC therapy    Frequency       Co-evaluation               AM-PAC PT "6 Clicks" Mobility  Outcome Measure Help needed turning from your back to your side while in a flat bed without using bedrails?: None Help needed moving from lying on your back to sitting on the side of a flat bed without using bedrails?: None Help needed moving to and from a bed to a chair (including a wheelchair)?: None Help needed standing up from a chair using your arms (e.g., wheelchair or bedside chair)?: None Help needed to walk in hospital room?: None Help needed climbing 3-5 steps with a railing? : None 6 Click Score: 24    End of Session   Activity Tolerance: Patient tolerated treatment well Patient left: with call bell/phone within reach;in bed Nurse Communication: Mobility status;Other (comment) (bottom lip numbness) PT Visit Diagnosis: Muscle weakness (generalized) (M62.81)    Time: 2355-7322 PT Time Calculation (min) (ACUTE ONLY): 21 min   Charges:   PT Evaluation $PT Eval Low Complexity: 1 Low          Vickie Ponds A. Gilford Rile PT, DPT Acute Rehabilitation Services Pager [phone] Office [phone]   Linna Hoff 08/04/2021, 9:42 AM

## 2021-08-04 NOTE — Plan of Care (Signed)

## 2021-08-04 NOTE — Discharge Summary (Signed)
Physician Discharge Summary  Patient ID: David Middleton MRN: [id number] DOB/AGE: [birth year] 66 y.o.  Admit date: 08/03/2021 Discharge date: 08/04/2021  Admission Diagnoses:lumbar L5/S1 hnp right  Discharge Diagnoses: samd Principal Problem:   HNP (herniated nucleus pulposus), lumbar   Discharged Condition: good  Hospital Course: David Middleton was admitted and taken to the operating room for an uncomplicated lumbar discetomy. Post op he is voiding, ambulating, and tolerating a regular diet. His wound is clean, dry, and without signs of infection.   Treatments: surgery: as above  Discharge Exam: Blood pressure 130/68, pulse 87, temperature 98.5 F (36.9 C), temperature source Oral, resp. rate 18, height '5\' 8"'$  (1.727 m), weight 88.5 kg, SpO2 96 %. General appearance: alert, cooperative, and no distress  Disposition: Discharge disposition: 01-Home or Self Care      Displacement of lumbosacral intervertebral disc  Allergies as of 08/04/2021   No Known Allergies      Medication List     TAKE these medications    Advil Dual Action 125-250 MG Tabs Generic drug: Ibuprofen-Acetaminophen Take 2 tablets by mouth 2 (two) times daily as needed (pain).   gabapentin 300 MG capsule Commonly known as: NEURONTIN Take 300 mg by mouth 3 (three) times daily.   lisinopril 40 MG tablet Commonly known as: ZESTRIL Take 10 mg by mouth in the morning.   meloxicam 15 MG tablet Commonly known as: MOBIC Take 1 tablet (15 mg total) by mouth daily.   methylPREDNISolone 4 MG Tbpk tablet Commonly known as: MEDROL DOSEPAK 6 day dose pack - take as directed   multivitamin with minerals tablet Take 1 tablet by mouth daily.   omeprazole 20 MG capsule Commonly known as: PRILOSEC Take 40 mg by mouth in the morning.   oxyCODONE 5 MG immediate release tablet Commonly known as: Oxy IR/ROXICODONE Take 1 tablet (5 mg total) by mouth every 6 (six) hours as needed for up to 8 days for moderate pain  ((score 4 to 6)). What changed:  when to take this reasons to take this   sertraline 100 MG tablet Commonly known as: ZOLOFT Take 200 mg by mouth daily.   tiZANidine 4 MG tablet Commonly known as: ZANAFLEX Take 1 tablet (4 mg total) by mouth every 6 (six) hours as needed for muscle spasms.   traZODone 100 MG tablet Commonly known as: DESYREL Take 100 mg by mouth at bedtime.        Follow-up Information     Ashok Pall, MD Follow up.   Specialty: Neurosurgery Why: keep your scheduled appointment Contact information: [address] [place] [address] [phone]                 Signed: Ashok Pall 08/04/2021, 11:38 AM

## 2021-08-04 NOTE — Progress Notes (Signed)
  Transition of Care [facility]) Screening Note   Patient Details  Name: David Middleton Date of Birth: 1955/07/12   Transition of Care [facility]) CM/SW Contact:    Geralynn Ochs, LCSW Phone Number: 08/04/2021, 10:59 AM    Transition of Care Department [facility]) has reviewed patient and no TOC needs have been identified at this time. We will continue to monitor patient advancement through interdisciplinary progression rounds. If new patient transition needs arise, please place a TOC consult.

## 2022-01-09 DIAGNOSIS — U071 COVID-19: Secondary | ICD-10-CM | POA: Diagnosis not present

## 2022-06-18 ENCOUNTER — Encounter: Payer: Self-pay | Admitting: Podiatry

## 2022-06-18 ENCOUNTER — Ambulatory Visit (INDEPENDENT_AMBULATORY_CARE_PROVIDER_SITE_OTHER): Payer: Medicare HMO

## 2022-06-18 ENCOUNTER — Ambulatory Visit: Payer: Medicare HMO | Admitting: Podiatry

## 2022-06-18 VITALS — BP 163/83 | HR 75

## 2022-06-18 DIAGNOSIS — M722 Plantar fascial fibromatosis: Secondary | ICD-10-CM | POA: Diagnosis not present

## 2022-06-18 MED ORDER — MELOXICAM 15 MG PO TABS: 15.0000 mg | ORAL_TABLET | Freq: Every day | ORAL | 3 refills | Status: DC

## 2022-06-18 NOTE — Progress Notes (Signed)
  Subjective:  Patient ID: David Middleton, male    DOB: 26-Aug-1955,  MRN: [id number]  Chief Complaint  Patient presents with   Foot Pain    "My heels are hurting."    67 y.o. male presents with the above complaint. History confirmed with patient.  Left is worse.  Objective:  Physical Exam: warm, good capillary refill, no trophic changes or ulcerative lesions, normal DP and PT pulses, and normal sensory exam.  Bilateral pes cavus Left Foot: point tenderness over the heel pad     Radiographs: Multiple views x-ray of both feet: no fracture, dislocation, swelling or degenerative changes noted and pes cavus foot type Assessment:   1. Plantar fasciitis      Plan:  Patient was evaluated and treated and all questions answered.  Discussed the etiology and treatment options for plantar fasciitis including stretching, formal physical therapy, supportive shoegears such as a running shoe or sneaker, pre fabricated orthoses, injection therapy, and oral medications. We also discussed the role of surgical treatment of this for patients who do not improve after exhausting non-surgical treatment options.   -XR reviewed with patient -Educated patient on stretching and icing of the affected limb -Injection delivered to the plantar fascia of the left foot. -Rx for meloxicam. Educated on use, risks and benefits of the medication  After sterile prep with povidone-iodine solution and alcohol, the left heel was injected with 0.5cc 2% xylocaine plain, 0.5cc 0.5% marcaine plain, 5mg  triamcinolone acetonide, and 2mg  dexamethasone was injected along the medial plantar fascia at the insertion on the plantar calcaneus. The patient tolerated the procedure well without complication.  Return in about 6 weeks (around 07/30/2022) for recheck plantar fasciitis.

## 2022-07-02 DIAGNOSIS — Z79899 Other long term (current) drug therapy: Secondary | ICD-10-CM | POA: Diagnosis not present

## 2022-07-02 DIAGNOSIS — Z1331 Encounter for screening for depression: Secondary | ICD-10-CM | POA: Diagnosis not present

## 2022-07-02 DIAGNOSIS — Z0000 Encounter for general adult medical examination without abnormal findings: Secondary | ICD-10-CM | POA: Diagnosis not present

## 2022-07-02 DIAGNOSIS — R5383 Other fatigue: Secondary | ICD-10-CM | POA: Diagnosis not present

## 2022-07-02 DIAGNOSIS — K219 Gastro-esophageal reflux disease without esophagitis: Secondary | ICD-10-CM | POA: Diagnosis not present

## 2022-07-02 DIAGNOSIS — G4762 Sleep related leg cramps: Secondary | ICD-10-CM | POA: Diagnosis not present

## 2022-07-02 DIAGNOSIS — K7460 Unspecified cirrhosis of liver: Secondary | ICD-10-CM | POA: Diagnosis not present

## 2022-07-02 DIAGNOSIS — Z139 Encounter for screening, unspecified: Secondary | ICD-10-CM | POA: Diagnosis not present

## 2022-07-02 DIAGNOSIS — Z13220 Encounter for screening for lipoid disorders: Secondary | ICD-10-CM | POA: Diagnosis not present

## 2022-07-02 DIAGNOSIS — F419 Anxiety disorder, unspecified: Secondary | ICD-10-CM | POA: Diagnosis not present

## 2022-07-02 DIAGNOSIS — I10 Essential (primary) hypertension: Secondary | ICD-10-CM | POA: Diagnosis not present

## 2022-07-02 DIAGNOSIS — Z125 Encounter for screening for malignant neoplasm of prostate: Secondary | ICD-10-CM | POA: Diagnosis not present

## 2022-07-02 DIAGNOSIS — F1220 Cannabis dependence, uncomplicated: Secondary | ICD-10-CM | POA: Diagnosis not present

## 2022-08-01 ENCOUNTER — Ambulatory Visit: Payer: Medicare HMO | Admitting: Podiatry

## 2022-08-19 ENCOUNTER — Other Ambulatory Visit: Payer: Self-pay | Admitting: Podiatry

## 2022-11-22 DIAGNOSIS — H40053 Ocular hypertension, bilateral: Secondary | ICD-10-CM | POA: Diagnosis not present

## 2022-11-22 DIAGNOSIS — H52222 Regular astigmatism, left eye: Secondary | ICD-10-CM | POA: Diagnosis not present

## 2022-11-22 DIAGNOSIS — H40003 Preglaucoma, unspecified, bilateral: Secondary | ICD-10-CM | POA: Diagnosis not present

## 2022-11-22 DIAGNOSIS — H47233 Glaucomatous optic atrophy, bilateral: Secondary | ICD-10-CM | POA: Diagnosis not present

## 2022-11-22 DIAGNOSIS — H524 Presbyopia: Secondary | ICD-10-CM | POA: Diagnosis not present

## 2022-11-22 DIAGNOSIS — H5231 Anisometropia: Secondary | ICD-10-CM | POA: Diagnosis not present

## 2022-11-22 DIAGNOSIS — H25813 Combined forms of age-related cataract, bilateral: Secondary | ICD-10-CM | POA: Diagnosis not present

## 2023-01-01 DIAGNOSIS — Z23 Encounter for immunization: Secondary | ICD-10-CM | POA: Diagnosis not present

## 2023-01-01 DIAGNOSIS — R739 Hyperglycemia, unspecified: Secondary | ICD-10-CM | POA: Diagnosis not present

## 2023-01-01 DIAGNOSIS — F1220 Cannabis dependence, uncomplicated: Secondary | ICD-10-CM | POA: Diagnosis not present

## 2023-01-01 DIAGNOSIS — Z9181 History of falling: Secondary | ICD-10-CM | POA: Diagnosis not present

## 2023-01-01 DIAGNOSIS — G4700 Insomnia, unspecified: Secondary | ICD-10-CM | POA: Diagnosis not present

## 2023-01-01 DIAGNOSIS — K7460 Unspecified cirrhosis of liver: Secondary | ICD-10-CM | POA: Diagnosis not present

## 2023-01-01 DIAGNOSIS — F419 Anxiety disorder, unspecified: Secondary | ICD-10-CM | POA: Diagnosis not present

## 2023-01-01 DIAGNOSIS — I10 Essential (primary) hypertension: Secondary | ICD-10-CM | POA: Diagnosis not present

## 2023-01-01 DIAGNOSIS — K219 Gastro-esophageal reflux disease without esophagitis: Secondary | ICD-10-CM | POA: Diagnosis not present

## 2023-02-25 DIAGNOSIS — H40053 Ocular hypertension, bilateral: Secondary | ICD-10-CM | POA: Diagnosis not present

## 2023-02-25 DIAGNOSIS — H47233 Glaucomatous optic atrophy, bilateral: Secondary | ICD-10-CM | POA: Diagnosis not present

## 2023-02-25 DIAGNOSIS — H40013 Open angle with borderline findings, low risk, bilateral: Secondary | ICD-10-CM | POA: Diagnosis not present

## 2023-07-04 DIAGNOSIS — F1220 Cannabis dependence, uncomplicated: Secondary | ICD-10-CM | POA: Diagnosis not present

## 2023-07-04 DIAGNOSIS — R7303 Prediabetes: Secondary | ICD-10-CM | POA: Diagnosis not present

## 2023-07-04 DIAGNOSIS — M79642 Pain in left hand: Secondary | ICD-10-CM | POA: Diagnosis not present

## 2023-07-04 DIAGNOSIS — Z1159 Encounter for screening for other viral diseases: Secondary | ICD-10-CM | POA: Diagnosis not present

## 2023-07-04 DIAGNOSIS — K219 Gastro-esophageal reflux disease without esophagitis: Secondary | ICD-10-CM | POA: Diagnosis not present

## 2023-07-04 DIAGNOSIS — M722 Plantar fascial fibromatosis: Secondary | ICD-10-CM | POA: Diagnosis not present

## 2023-07-04 DIAGNOSIS — Z125 Encounter for screening for malignant neoplasm of prostate: Secondary | ICD-10-CM | POA: Diagnosis not present

## 2023-07-04 DIAGNOSIS — I10 Essential (primary) hypertension: Secondary | ICD-10-CM | POA: Diagnosis not present

## 2023-07-04 DIAGNOSIS — F419 Anxiety disorder, unspecified: Secondary | ICD-10-CM | POA: Diagnosis not present

## 2023-07-04 DIAGNOSIS — G4700 Insomnia, unspecified: Secondary | ICD-10-CM | POA: Diagnosis not present

## 2023-07-04 DIAGNOSIS — K7460 Unspecified cirrhosis of liver: Secondary | ICD-10-CM | POA: Diagnosis not present

## 2023-07-17 DIAGNOSIS — G4730 Sleep apnea, unspecified: Secondary | ICD-10-CM | POA: Diagnosis not present

## 2023-08-12 DIAGNOSIS — Z860100 Personal history of colon polyps, unspecified: Secondary | ICD-10-CM | POA: Diagnosis not present

## 2023-08-12 DIAGNOSIS — K7460 Unspecified cirrhosis of liver: Secondary | ICD-10-CM | POA: Diagnosis not present

## 2023-08-12 DIAGNOSIS — K219 Gastro-esophageal reflux disease without esophagitis: Secondary | ICD-10-CM | POA: Diagnosis not present

## 2023-08-21 IMAGING — CR DG LUMBAR SPINE 2-3V
3 series · 3 of 3 positions shown · non-contrast
Comparison: MRI 07/23/2021

CLINICAL DATA: Four localization

EXAM:
LUMBAR SPINE - 2-3 VIEW

[xtable lateral (1 of 3)]
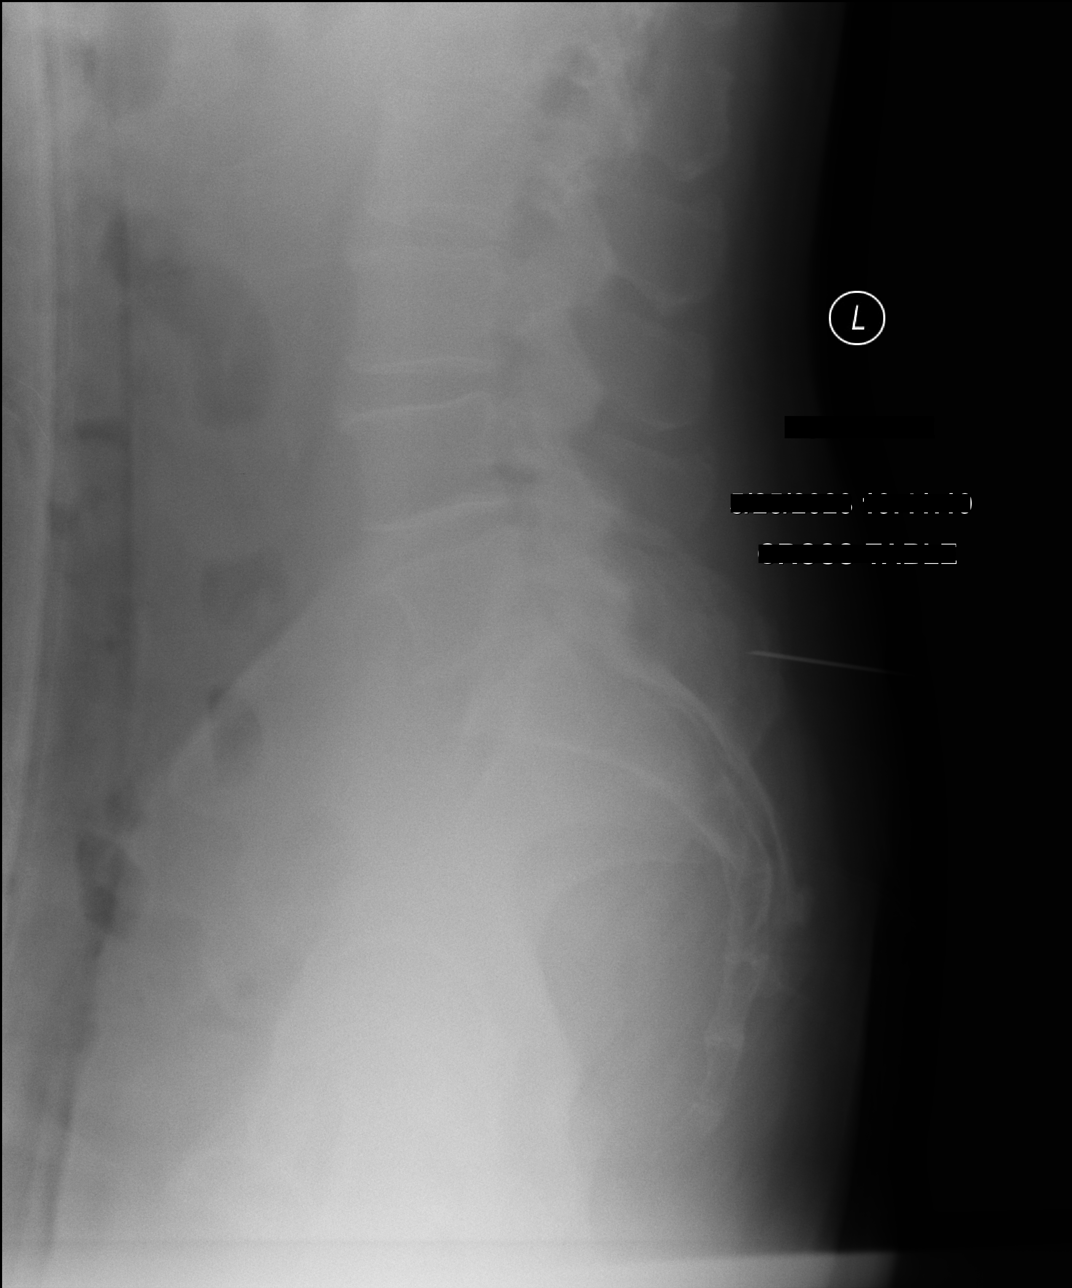

[xtable lateral (2 of 3)]
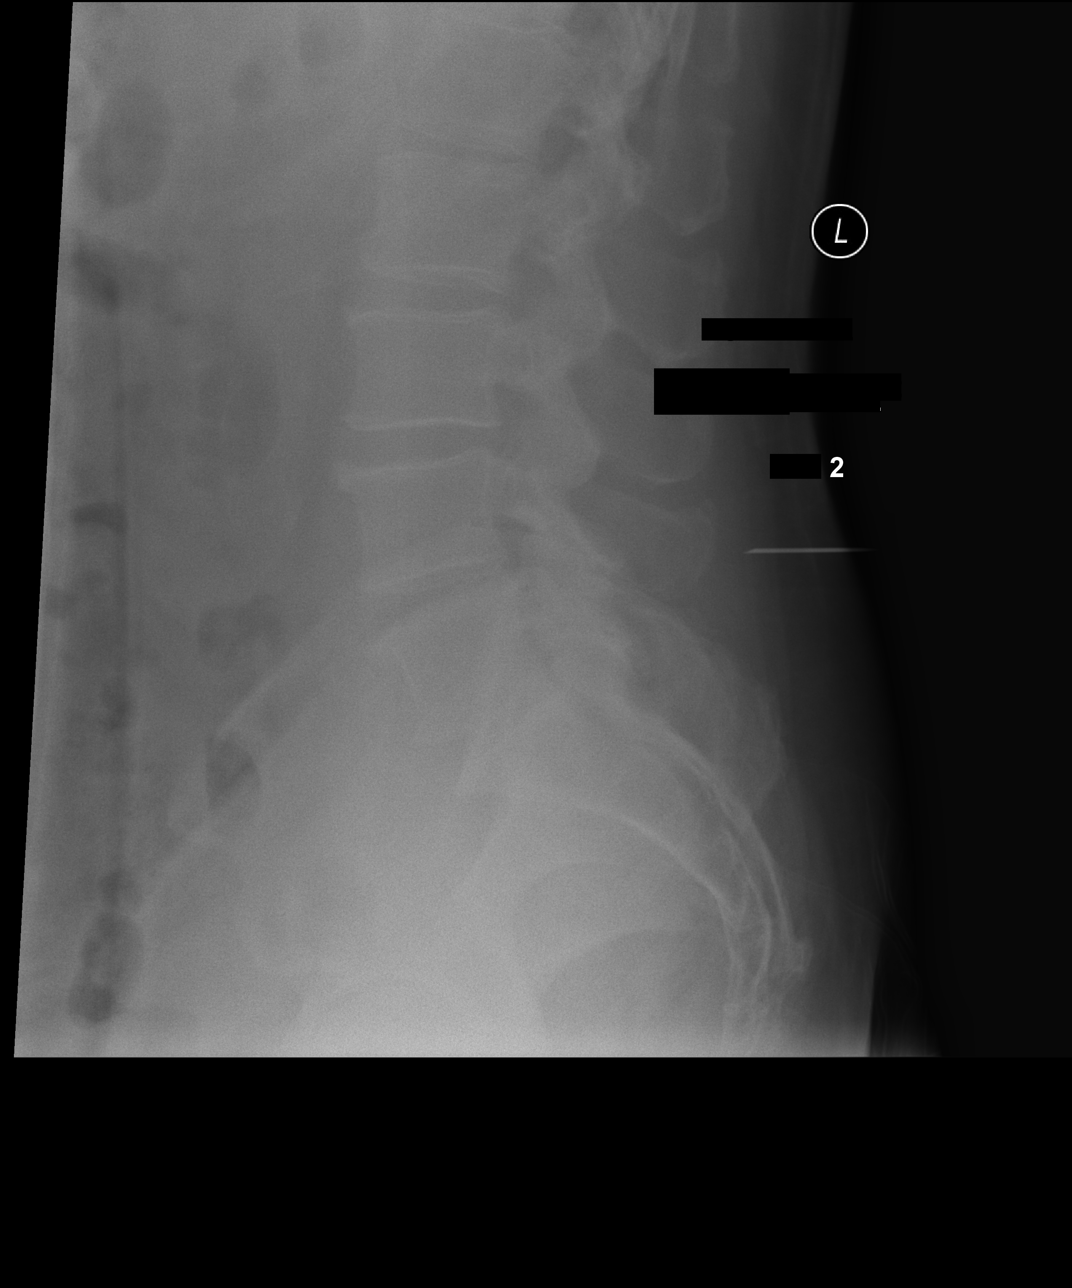

[xtable lateral (3 of 3)]
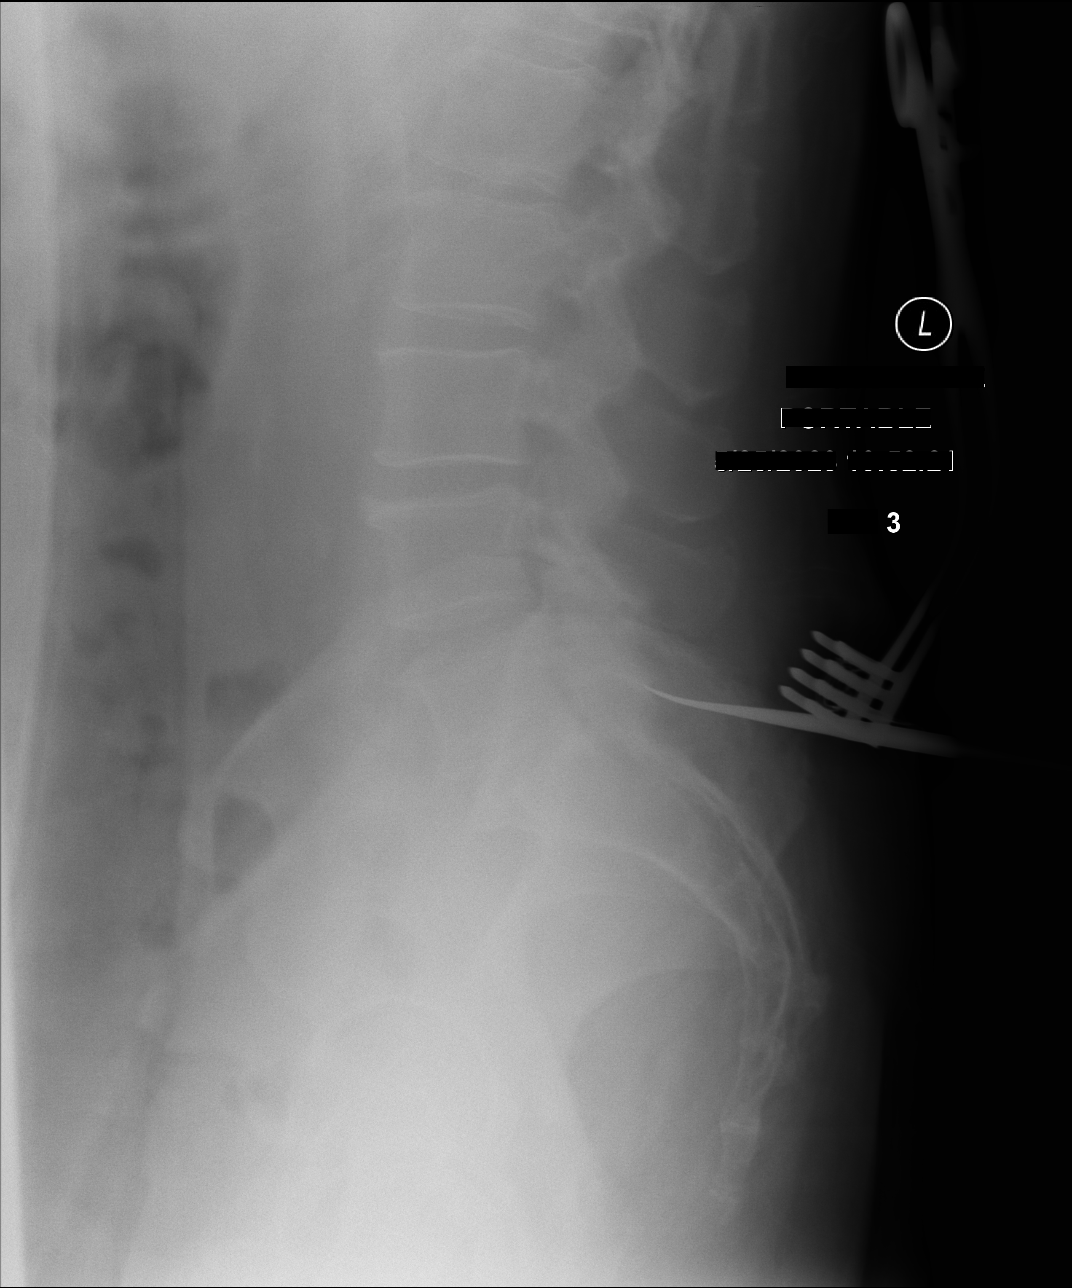

[3 of 3 positions shown; findings below may reference images not displayed]

FINDINGS: Three intraoperative lateral views of the lumbar spine are
submitted. Image at [DATE] demonstrates linear localizing instrument
overlying the posterior soft tissues at the level of L5-S1. Image
labeled film 2 demonstrates linear localizing instrument over the
soft tissues posterior to the spinous process of L4. Image labeled
film 3 demonstrates surgical instruments and linear localizing
instruments overlying the posterior elements at L5-S1
IMPRESSION: Limited intraoperative views of the lumbar spine obtained for
localization.

## 2023-10-24 DIAGNOSIS — K7460 Unspecified cirrhosis of liver: Secondary | ICD-10-CM | POA: Diagnosis not present

## 2023-11-08 DIAGNOSIS — I10 Essential (primary) hypertension: Secondary | ICD-10-CM | POA: Diagnosis not present

## 2023-11-25 DIAGNOSIS — H25813 Combined forms of age-related cataract, bilateral: Secondary | ICD-10-CM | POA: Diagnosis not present

## 2023-11-25 DIAGNOSIS — H524 Presbyopia: Secondary | ICD-10-CM | POA: Diagnosis not present

## 2023-11-25 DIAGNOSIS — H5231 Anisometropia: Secondary | ICD-10-CM | POA: Diagnosis not present

## 2023-11-25 DIAGNOSIS — H52223 Regular astigmatism, bilateral: Secondary | ICD-10-CM | POA: Diagnosis not present

## 2023-11-25 DIAGNOSIS — H40003 Preglaucoma, unspecified, bilateral: Secondary | ICD-10-CM | POA: Diagnosis not present

## 2023-11-25 DIAGNOSIS — H40053 Ocular hypertension, bilateral: Secondary | ICD-10-CM | POA: Diagnosis not present

## 2023-11-25 DIAGNOSIS — H47233 Glaucomatous optic atrophy, bilateral: Secondary | ICD-10-CM | POA: Diagnosis not present

## 2023-11-28 DIAGNOSIS — L0390 Cellulitis, unspecified: Secondary | ICD-10-CM | POA: Diagnosis not present

## 2024-01-08 DIAGNOSIS — E7800 Pure hypercholesterolemia, unspecified: Secondary | ICD-10-CM | POA: Diagnosis not present

## 2024-01-08 DIAGNOSIS — F1220 Cannabis dependence, uncomplicated: Secondary | ICD-10-CM | POA: Diagnosis not present

## 2024-01-08 DIAGNOSIS — K7460 Unspecified cirrhosis of liver: Secondary | ICD-10-CM | POA: Diagnosis not present

## 2024-01-08 DIAGNOSIS — Z1331 Encounter for screening for depression: Secondary | ICD-10-CM | POA: Diagnosis not present

## 2024-01-08 DIAGNOSIS — Z23 Encounter for immunization: Secondary | ICD-10-CM | POA: Diagnosis not present

## 2024-01-08 DIAGNOSIS — F419 Anxiety disorder, unspecified: Secondary | ICD-10-CM | POA: Diagnosis not present

## 2024-01-08 DIAGNOSIS — R7303 Prediabetes: Secondary | ICD-10-CM | POA: Diagnosis not present

## 2024-01-08 DIAGNOSIS — K219 Gastro-esophageal reflux disease without esophagitis: Secondary | ICD-10-CM | POA: Diagnosis not present

## 2024-01-08 DIAGNOSIS — I10 Essential (primary) hypertension: Secondary | ICD-10-CM | POA: Diagnosis not present

## 2024-01-08 DIAGNOSIS — Z9181 History of falling: Secondary | ICD-10-CM | POA: Diagnosis not present
